# Patient Record
Sex: Female | Born: 1990 | Race: Black or African American | Hispanic: No | Marital: Single | Smoking: Never smoker
Health system: Southern US, Community
[De-identification: ages and names within clinical notes are randomized; demographics above are authoritative.]

## PROBLEM LIST (undated history)

## (undated) DIAGNOSIS — F32A Depression, unspecified: Secondary | ICD-10-CM

## (undated) DIAGNOSIS — I10 Essential (primary) hypertension: Secondary | ICD-10-CM

## (undated) DIAGNOSIS — K219 Gastro-esophageal reflux disease without esophagitis: Secondary | ICD-10-CM

## (undated) DIAGNOSIS — J45909 Unspecified asthma, uncomplicated: Secondary | ICD-10-CM

## (undated) DIAGNOSIS — M1990 Unspecified osteoarthritis, unspecified site: Secondary | ICD-10-CM

## (undated) DIAGNOSIS — F419 Anxiety disorder, unspecified: Secondary | ICD-10-CM

## (undated) DIAGNOSIS — T7840XA Allergy, unspecified, initial encounter: Secondary | ICD-10-CM

## (undated) HISTORY — DX: Unspecified asthma, uncomplicated: J45.909

## (undated) HISTORY — DX: Depression, unspecified: F32.A

## (undated) HISTORY — DX: Gastro-esophageal reflux disease without esophagitis: K21.9

## (undated) HISTORY — DX: Anxiety disorder, unspecified: F41.9

## (undated) HISTORY — DX: Allergy, unspecified, initial encounter: T78.40XA

## (undated) HISTORY — DX: Unspecified osteoarthritis, unspecified site: M19.90

---

## 2015-04-17 DIAGNOSIS — G43009 Migraine without aura, not intractable, without status migrainosus: Secondary | ICD-10-CM | POA: Insufficient documentation

## 2021-10-14 ENCOUNTER — Ambulatory Visit
Admission: EM | Admit: 2021-10-14 | Discharge: 2021-10-14 | Disposition: A | Discharge status: Home / Self Care | Payer: 59 | Attending: Physician Assistant | Admitting: Physician Assistant

## 2021-10-14 ENCOUNTER — Ambulatory Visit: Admission: EM | Admit: 2021-10-14 | Payer: Self-pay | Source: Home / Self Care

## 2021-10-14 DIAGNOSIS — T161XXA Foreign body in right ear, initial encounter: Secondary | ICD-10-CM | POA: Insufficient documentation

## 2021-10-14 DIAGNOSIS — I10 Essential (primary) hypertension: Secondary | ICD-10-CM | POA: Insufficient documentation

## 2021-10-14 DIAGNOSIS — J029 Acute pharyngitis, unspecified: Secondary | ICD-10-CM | POA: Insufficient documentation

## 2021-10-14 HISTORY — DX: Essential (primary) hypertension: I10

## 2021-10-14 LAB — GROUP A STREP BY PCR: Group A Strep by PCR: NOT DETECTED

## 2021-10-14 MED ORDER — AMOXICILLIN 500 MG PO CAPS: 500.0000 mg | ORAL_CAPSULE | Freq: Three times a day (TID) | ORAL | 0 refills | Status: AC

## 2021-10-14 NOTE — ED Provider Notes (Addendum)
[facility]    CSN: [id number] Arrival date & time: 10/14/21  1242      History   Chief Complaint Chief Complaint  Patient presents with   Sore Throat    HPI Brenda Salas is a 31 y.o. female.   Patient presents with fever, chills, body aches, sore throat and generalized headache for 1 day.  Decreased appetite but tolerating liquids.  No known sick contacts.  Has attempted use of Tylenol, ibuprofen and Mucinex which have been ineffective.  Denies coughing, shortness of breath, wheezing, congestion, abdominal pain, nausea, vomiting or diarrhea.  History of hypertension.    Past Medical History:  Diagnosis Date   Hypertension     Patient Active Problem List   Diagnosis Date Noted   Hypertension 10/14/2021   Migraine without aura or status migrainosus 04/17/2015    Past Surgical History:  Procedure Laterality Date   CESAREAN SECTION  2017    OB History   No obstetric history on file.      Home Medications    Prior to Admission medications   Medication Sig Start Date End Date Taking? Authorizing Provider  clindamycin (CLEOCIN T) 1 % lotion Apply topically 2 (two) times daily. 03/27/21  Yes [provider]  etonogestrel-ethinyl estradiol (NUVARING) 0.12-0.015 MG/24HR vaginal ring Place vaginally. 10/02/21  Yes [provider]  ibuprofen (ADVIL) 800 MG tablet Take by mouth. 05/09/20  Yes [provider]  losartan (COZAAR) 25 MG tablet Take 25 mg by mouth daily. 08/23/21  Yes [provider]  tranexamic acid (LYSTEDA) 650 MG TABS tablet Take 1,300 mg by mouth 3 (three) times daily. 09/17/21   [provider]    Family History No family history on file.  Social History Social History   Tobacco Use   Smoking status: Never   Smokeless tobacco: Never  Vaping Use   Vaping Use: Never used  Substance Use Topics   Alcohol use: Yes    Comment: Occ.   Drug use: Never     Allergies   Other   Review of  Systems Review of Systems Defer to HPI    Physical Exam Triage Vital Signs ED Triage Vitals  Enc Vitals Group     BP 10/14/21 1335 (!) 193/137     Pulse Rate 10/14/21 1335 (!) 115     Resp 10/14/21 1335 (!) 22     Temp 10/14/21 1335 (!) 100.6 F (38.1 C)     Temp Source 10/14/21 1335 Oral     SpO2 10/14/21 1335 100 %     Weight 10/14/21 1331 (!) 302 lb (137 kg)     Height 10/14/21 1331 5\' 4"  (1.626 m)     Head Circumference --      Peak Flow --      Pain Score 10/14/21 1323 9     Pain Loc --      Pain Edu? --      Excl. in GC? --    No data found.  Updated Vital Signs BP (!) 190/126 (BP Location: Left Arm)   Pulse (!) 115   Temp (!) 100.6 F (38.1 C) (Oral)   Resp (!) 22   Ht 5\' 4"  (1.626 m)   Wt (!) 302 lb (137 kg)   LMP  (LMP Unknown)   SpO2 100%   BMI 51.84 kg/m   Visual Acuity Right Eye Distance:   Left Eye Distance:   Bilateral Distance:    Right Eye Near:   Left  Eye Near:    Bilateral Near:     Physical Exam Constitutional:      Appearance: Normal appearance. She is well-developed.  HENT:     Head: Normocephalic.     Right Ear: Tympanic membrane and external ear normal.     Left Ear: Tympanic membrane, ear canal and external ear normal.     Ears:     Comments: Foreign body present to the right ear canal    Nose: No congestion or rhinorrhea.     Mouth/Throat:     Mouth: Mucous membranes are moist.     Pharynx: Posterior oropharyngeal erythema present.     Tonsils: Tonsillar exudate present. 2+ on the right. 2+ on the left.  Eyes:     Extraocular Movements: Extraocular movements intact.  Cardiovascular:     Rate and Rhythm: Regular rhythm. Tachycardia present.     Heart sounds: Normal heart sounds.  Pulmonary:     Effort: Pulmonary effort is normal.     Breath sounds: Normal breath sounds.  Musculoskeletal:     Cervical back: Normal range of motion and neck supple.  Neurological:     General: No focal deficit present.     Mental Status:  She is alert and oriented to person, place, and time.  Psychiatric:        Mood and Affect: Mood normal.        Behavior: Behavior normal.     UC Treatments / Results  Labs (all labs ordered are listed, but only abnormal results are displayed) Labs Reviewed  GROUP A STREP BY PCR    EKG   Radiology No results found.  Procedures Foreign Body Removal  Date/Time: 10/14/2021 1:56 PM Performed by: Valinda Hoar, NP Authorized by: Valinda Hoar, NP   Consent:    Consent obtained:  Verbal   Consent given by:  Patient   Risks, benefits, and alternatives were discussed: yes     Risks discussed:  Incomplete removal Universal protocol:    Procedure explained and questions answered to patient or proxy's satisfaction: yes     Patient identity confirmed:  Verbally with patient Location:    Location:  Ear   Ear location:  R ear   Depth: canal. Anesthesia:    Anesthesia method:  None Procedure details:    Localization method:  Visualized   Removal mechanism:  Forceps   Foreign bodies recovered:  1   Description:  Clear rubber back of earring   Intact foreign body removal: yes   Post-procedure details:    Neurovascular status: intact     Confirmation:  No additional foreign bodies on visualization   Skin closure:  None   Procedure completion:  Tolerated (including critical care time)  Medications Ordered in UC Medications - No data to display  Initial Impression / Assessment and Plan / UC Course  I have reviewed the triage vital signs and the nursing notes.  Pertinent labs & imaging results that were available during my care of the patient were reviewed by me and considered in my medical decision making (see chart for details).  Sore throat Foreign body of right ear, initial encounter  Fever 100.6 with associated tachycardia noted in triage, blood pressure 190/126, patient has not taken blood pressure medication today, no signs of hypertensive urgency, advised  patient to take medicine upon returning home, will move forward with treatment of strep based on examination as erythema, tonsillar adenopathy and exudate are noted on exam, amoxicillin 10-day course prescribed,  recommended continued use of Tylenol and ibuprofen for management of fevers and discomfort, may attempt salt water gargles, soft foods, warm liquids, teaspoons of honey and throat lozenges for additional support, may follow-up with urgent care or PCP as needed for persisting symptoms, work note given  Foreign body noted on examination, patient had no knowledge of presents, foreign body removed in its entirety, no further abnormalities noted to the right ear  Final diagnoses:  Sorethroat   Discharge Instructions   None    ED Prescriptions   None    PDMP not reviewed this encounter.   Valinda HoarWhite, Shaylon Gillean R, NP 10/14/21 1400    Salli QuarryWhite, Abryana Lykens R, [place]NP 10/19/21 [phone]

## 2021-10-14 NOTE — Discharge Instructions (Addendum)
We will move forward with treatment of strep based on your symptoms and presentation of your throat as it is reddened your tonsils are swollen and there are Brenda Salas patches with a fever in triage ? ?We will call you if your strep test is positive ? ?Take amoxicillin twice daily for the next 10 days ? ?Take Tylenol or ibuprofen every 6 hours for management of fevers and discomfort ? ?You may attempt salt water gargles, warm liquids, soft foods and teaspoons of honey for additional support ? ?Increase your fluid intake through use of water and electrolyte replacement substances to maintain hydration and to your appetite returns ? ?You may follow-up with urgent care as needed for persisting or worsening symptoms ? ? ? ?

## 2021-10-14 NOTE — ED Triage Notes (Signed)
Patient is here for "sore throat". "White patches seen in throat". Started on "Tuesday". No fever. No new/unexplained rash.  ?

## 2021-10-21 ENCOUNTER — Other Ambulatory Visit: Payer: Self-pay

## 2021-10-21 ENCOUNTER — Emergency Department
Admission: EM | Admit: 2021-10-21 | Discharge: 2021-10-21 | Disposition: A | Discharge status: Home / Self Care | Payer: 59 | Attending: Emergency Medicine | Admitting: Emergency Medicine

## 2021-10-21 ENCOUNTER — Emergency Department: Payer: 59

## 2021-10-21 ENCOUNTER — Ambulatory Visit
Admission: EM | Admit: 2021-10-21 | Discharge: 2021-10-21 | Disposition: A | Discharge status: Home / Self Care | Payer: 59

## 2021-10-21 DIAGNOSIS — I10 Essential (primary) hypertension: Secondary | ICD-10-CM | POA: Insufficient documentation

## 2021-10-21 DIAGNOSIS — R42 Dizziness and giddiness: Secondary | ICD-10-CM

## 2021-10-21 DIAGNOSIS — Z79899 Other long term (current) drug therapy: Secondary | ICD-10-CM | POA: Insufficient documentation

## 2021-10-21 DIAGNOSIS — H81391 Other peripheral vertigo, right ear: Secondary | ICD-10-CM | POA: Insufficient documentation

## 2021-10-21 LAB — URINALYSIS, ROUTINE W REFLEX MICROSCOPIC
Bilirubin Urine: NEGATIVE
Glucose, UA: NEGATIVE mg/dL
Ketones, ur: NEGATIVE mg/dL
Leukocytes,Ua: NEGATIVE
Nitrite: NEGATIVE
Protein, ur: 30 mg/dL — AB
Specific Gravity, Urine: 1.025 (ref 1.005–1.030)
pH: 5 (ref 5.0–8.0)

## 2021-10-21 LAB — CBC
HCT: 44.2 % (ref 36.0–46.0)
Hemoglobin: 14.3 g/dL (ref 12.0–15.0)
MCH: 28.4 pg (ref 26.0–34.0)
MCHC: 32.4 g/dL (ref 30.0–36.0)
MCV: 87.7 fL (ref 80.0–100.0)
Platelets: 511 10*3/uL — ABNORMAL HIGH (ref 150–400)
RBC: 5.04 MIL/uL (ref 3.87–5.11)
RDW: 12.3 % (ref 11.5–15.5)
WBC: 10.3 10*3/uL (ref 4.0–10.5)
nRBC: 0 % (ref 0.0–0.2)

## 2021-10-21 LAB — BASIC METABOLIC PANEL
Anion gap: 10 (ref 5–15)
BUN: 12 mg/dL (ref 6–20)
CO2: 24 mmol/L (ref 22–32)
Calcium: 9.7 mg/dL (ref 8.9–10.3)
Chloride: 103 mmol/L (ref 98–111)
Creatinine, Ser: 0.67 mg/dL (ref 0.44–1.00)
GFR, Estimated: >60 mL/min (ref >60)
Glucose, Bld: 97 mg/dL (ref 70–99)
Potassium: 3.8 mmol/L (ref 3.5–5.1)
Sodium: 137 mmol/L (ref 135–145)

## 2021-10-21 MED ORDER — CLONIDINE HCL 0.1 MG PO TABS
0.2000 mg | ORAL_TABLET | Freq: Once | ORAL | Status: AC
  Administered 2021-10-21: 0.2 mg via ORAL
  Filled 2021-10-21: qty 2

## 2021-10-21 MED ORDER — MECLIZINE HCL 25 MG PO TABS
25.0000 mg | ORAL_TABLET | Freq: Once | ORAL | Status: AC
  Administered 2021-10-21: 25 mg via ORAL

## 2021-10-21 MED ORDER — AMLODIPINE BESYLATE 10 MG PO TABS: 10.0000 mg | ORAL_TABLET | Freq: Every day | ORAL | 2 refills | Status: DC

## 2021-10-21 MED ORDER — AMLODIPINE BESYLATE 5 MG PO TABS
10.0000 mg | ORAL_TABLET | Freq: Once | ORAL | Status: AC
  Administered 2021-10-21: 10 mg via ORAL
  Filled 2021-10-21: qty 2

## 2021-10-21 NOTE — ED Triage Notes (Signed)
Pt comes with c/o dizziness since last night. Pt states elevated BP as well. Pt states she take BP meds. Pt denies any headache, numbness or tingling.

## 2021-10-21 NOTE — ED Provider Notes (Signed)
[facility] Provider Note    Event Date/Time   First MD Initiated Contact with Patient 10/21/21 1457     (approximate)   History   Chief Complaint: Dizziness   HPI  Brenda Salas is a 31 y.o. female with a history of hypertension and migraines who comes ED complaining of dizziness since last night.  Dizziness feels like room spinning and being off balance, triggered by turning her head to the right, alleviated by turning her head to the left.  No vision changes or focal paresthesias or motor weakness.  No recent trauma, no fevers or neck pain or stiffness.  No headache.  She went to urgent care where she was found to be severely hypertensive.  They gave a dose of meclizine which did help with symptoms and she reports it is almost resolved at this point, but blood pressure is still very elevated.  She notes that she used to be taking only amlodipine 10 mg once a day for blood pressure but this did not provide adequate control so her doctor discontinued the amlodipine and started her on Cozaar 25 mg once a day.  She has been compliant with this medicine.  She was given a dose of clonidine earlier at 12:00 PM in the ED.     Physical Exam   Triage Vital Signs: ED Triage Vitals  Enc Vitals Group     BP 10/21/21 1136 (!) 237/146     Pulse Rate 10/21/21 1136 (!) 117     Resp 10/21/21 1136 20     Temp 10/21/21 1136 98 F (36.7 C)     Temp Source 10/21/21 1137 Oral     SpO2 10/21/21 1136 100 %     Weight --      Height --      Head Circumference --      Peak Flow --      Pain Score 10/21/21 1134 0     Pain Loc --      Pain Edu? --      Excl. in GC? --     Most recent vital signs: Vitals:   10/21/21 1501 10/21/21 1559  BP: (!) 203/113 (!) 203/122  Pulse: 85 80  Resp: 17 18  Temp:    SpO2: 100% 98%    General: Awake, no distress.  CV:  Good peripheral perfusion.  Resp:  Normal effort.  Abd:  No distention.  Other:  EOMI, normal balance,  normal gait.  Normal speech.  Cranial nerves II through XII intact.   ED Results / Procedures / Treatments   Labs (all labs ordered are listed, but only abnormal results are displayed) Labs Reviewed  CBC - Abnormal; Notable for the following components:      Result Value   Platelets 511 (*)    All other components within normal limits  URINALYSIS, ROUTINE W REFLEX MICROSCOPIC - Abnormal; Notable for the following components:   Color, Urine YELLOW (*)    APPearance HAZY (*)    Hgb urine dipstick MODERATE (*)    Protein, ur 30 (*)    Bacteria, UA FEW (*)    All other components within normal limits  BASIC METABOLIC PANEL  CBG MONITORING, ED  POC URINE PREG, ED     EKG    RADIOLOGY CT head viewed and interpreted by me, negative for intracranial hemorrhage or mass.  Radiology report reviewed.   PROCEDURES:  Procedures   MEDICATIONS ORDERED IN ED: Medications  cloNIDine (CATAPRES)  tablet 0.2 mg (0.2 mg Oral Given 10/21/21 1148)  amLODipine (NORVASC) tablet 10 mg (10 mg Oral Given 10/21/21 1559)     IMPRESSION / MDM / ASSESSMENT AND PLAN / ED COURSE  I reviewed the triage vital signs and the nursing notes.                              Differential diagnosis includes, but is not limited to, symptomatic hypertension, peripheral vertigo, intracranial hemorrhage, intracranial mass  Patient's presentation is most consistent with acute presentation with potential threat to life or bodily function.  Patient presents with severe uncontrolled hypertension, which I think is due to transition from Norvasc to Cozaar.  Cozaar is at relatively low dose as well.  I think she will need at least 2 blood pressure medicines going forward given the degree of hypertension.  After clonidine, blood pressure improved slightly to 200/110.  I will give her 10 mg Norvasc and plan for her to take Norvasc and Cozaar until primary care follow-up.  Due to her very high blood pressure and neuro  symptoms, I am obtaining a CT of the head to evaluate for intracranial hemorrhage or structural abnormality.  She does not have any focal neurodeficits, so I think if this is okay she will not require admission.   Clinical Course as of 10/21/21 1651  Wed Oct 21, 2021  1622 CT head is normal. [PS]    Clinical Course User Index [PS] Sharman Cheek, MD     FINAL CLINICAL IMPRESSION(S) / ED DIAGNOSES   Final diagnoses:  Peripheral vertigo involving right ear  Hypertension, unspecified type     Rx / DC Orders   ED Discharge Orders          Ordered    amLODipine (NORVASC) 10 MG tablet  Daily        10/21/21 1650             Note:  This document was prepared using Dragon voice recognition software and may include unintentional dictation errors.   Sharman Cheek, MD 10/21/21 1651

## 2021-10-21 NOTE — Discharge Instructions (Addendum)
Due to your continued elevated blood pressure combined with your neurological symptoms of dizziness and room spinning I feel that you would best be served to be evaluated in the emergency department.  Your elevated blood pressure may be contributing to your symptoms and you may need pharmacological intervention to bring it down.  We do not have any blood pressure medication here in the urgent care to give you.  Please go to [facility] as we discussed for evaluation.

## 2021-10-21 NOTE — ED Provider Notes (Signed)
[facility]    CSN: [id number] Arrival date & time: 10/21/21  0815      History   Chief Complaint Chief Complaint  Patient presents with   Dizziness    HPI Brenda Salas is a 31 y.o. female.   HPI  31 year old female here for evaluation of dizziness.  Patient reports that her dizziness started abruptly when she turned from her left side to her right side in bed.  This morning her dizziness was significant enough that it caused her to have nausea and vomiting.  She states that she took her blood pressure medication after her vomiting and has not had any further episodes.  She states that her dizziness improves when she keeps her head cocked to the right-hand side but when she looks back to the left her symptoms worsen.  She denies any change in vision, headache, chest pain, or shortness of breath.  Significant medical history includes hypertension and a BMI of 51.  Vital signs obtained at triage show a blood pressure of 222/119 with a follow-up reading of 225/119.  Staff took vital signs standing at 0-minute showing 194/120 and then at 3-minute showing 201/190.  Past Medical History:  Diagnosis Date   Hypertension     Patient Active Problem List   Diagnosis Date Noted   Hypertension 10/14/2021   Migraine without aura or status migrainosus 04/17/2015    Past Surgical History:  Procedure Laterality Date   CESAREAN SECTION  2017    OB History   No obstetric history on file.      Home Medications    Prior to Admission medications   Medication Sig Start Date End Date Taking? Authorizing Provider  amoxicillin (AMOXIL) 500 MG capsule Take 1 capsule (500 mg total) by mouth 3 (three) times daily for 10 days. Patient taking differently: Take 500 mg by mouth 3 (three) times daily. Not yet, Haven't taken today's dose. 10/14/21 10/24/21 Yes White, Adrienne R, NP  losartan (COZAAR) 25 MG tablet Take 25 mg by mouth daily. 08/23/21  Yes [provider]   amoxicillin (AMOXIL) 500 MG tablet Take 500 mg by mouth 3 (three) times daily. 10/14/21   [provider]  clindamycin (CLEOCIN T) 1 % lotion Apply topically 2 (two) times daily. 03/27/21   [provider]  etonogestrel-ethinyl estradiol (NUVARING) 0.12-0.015 MG/24HR vaginal ring Place vaginally. 10/02/21   [provider]  ibuprofen (ADVIL) 800 MG tablet Take by mouth. 05/09/20   [provider]  tranexamic acid (LYSTEDA) 650 MG TABS tablet Take 1,300 mg by mouth 3 (three) times daily. 09/17/21   [provider]    Family History History reviewed. No pertinent family history.  Social History Social History   Tobacco Use   Smoking status: Never   Smokeless tobacco: Never  Vaping Use   Vaping Use: Never used  Substance Use Topics   Alcohol use: Yes    Comment: Occ.   Drug use: Never     Allergies   Other   Review of Systems Review of Systems  Eyes:  Negative for visual disturbance.  Respiratory:  Negative for shortness of breath.   Cardiovascular:  Negative for chest pain.  Gastrointestinal:  Positive for nausea and vomiting.  Neurological:  Positive for dizziness. Negative for headaches.  Hematological: Negative.   Psychiatric/Behavioral: Negative.      Physical Exam Triage Vital Signs ED Triage Vitals  Enc Vitals Group     BP --      Pulse Rate  10/21/21 0831 88     Resp 10/21/21 0831 20     Temp 10/21/21 0831 98.2 F (36.8 C)     Temp Source 10/21/21 0831 Oral     SpO2 10/21/21 0831 99 %     Weight 10/21/21 0829 (!) 302 lb (137 kg)     Height 10/21/21 0829 5\' 4"  (1.626 m)     Head Circumference --      Peak Flow --      Pain Score --      Pain Loc --      Pain Edu? --      Excl. in GC? --    Orthostatic VS for the past 24 hrs:  BP- Lying Pulse- Lying BP- Standing at 0 minutes Pulse- Standing at 0 minutes  10/21/21 0834 (!) 222/119 94 (!) 194/120 113    Updated Vital Signs BP (!) 217/113 (BP Location: Left  Arm) Comment: 30+ mins post Antivert.  Pulse 88   Temp 98.2 F (36.8 C) (Oral)   Resp 20   Ht 5\' 4"  (1.626 m)   Wt (!) 302 lb (137 kg)   LMP 10/14/2021 (Approximate)   SpO2 99%   BMI 51.84 kg/m   Visual Acuity Right Eye Distance:   Left Eye Distance:   Bilateral Distance:    Right Eye Near:   Left Eye Near:    Bilateral Near:     Physical Exam Vitals and nursing note reviewed.  Constitutional:      Appearance: Normal appearance. She is not ill-appearing.  HENT:     Head: Normocephalic and atraumatic.     Right Ear: Tympanic membrane, ear canal and external ear normal. There is no impacted cerumen.     Left Ear: Tympanic membrane, ear canal and external ear normal. There is no impacted cerumen.     Mouth/Throat:     Mouth: Mucous membranes are moist.     Pharynx: Oropharynx is clear. No oropharyngeal exudate or posterior oropharyngeal erythema.  Eyes:     General: No scleral icterus.    Extraocular Movements: Extraocular movements intact.     Conjunctiva/sclera: Conjunctivae normal.     Pupils: Pupils are equal, round, and reactive to light.  Neck:     Vascular: No carotid bruit.  Cardiovascular:     Rate and Rhythm: Normal rate and regular rhythm.     Pulses: Normal pulses.     Heart sounds: Normal heart sounds. No murmur heard.   No friction rub. No gallop.  Pulmonary:     Effort: Pulmonary effort is normal.     Breath sounds: Normal breath sounds. No wheezing, rhonchi or rales.  Musculoskeletal:     Cervical back: Normal range of motion and neck supple.  Skin:    General: Skin is warm and dry.     Capillary Refill: Capillary refill takes less than 2 seconds.     Findings: No erythema or rash.  Neurological:     General: No focal deficit present.     Mental Status: She is alert and oriented to person, place, and time.  Psychiatric:        Mood and Affect: Mood normal.        Behavior: Behavior normal.        Thought Content: Thought content normal.         Judgment: Judgment normal.     UC Treatments / Results  Labs (all labs ordered are listed, but only abnormal results are displayed) Labs Reviewed -  No data to display  EKG EKG shows sinus tachycardia with ventricular of 101 bpm PR interval 146 ms QRS duration 94 ms QT/QTc 354/459 ms No ST or T wave abnormalities.  Lead V5 will not register and therefore the EKG is incomplete. No other tracings available for comparison in epic   Radiology No results found.  Procedures Procedures (including critical care time)  Medications Ordered in UC Medications  meclizine (ANTIVERT) tablet 25 mg (25 mg Oral Given 10/21/21 0931)    Initial Impression / Assessment and Plan / UC Course  I have reviewed the triage vital signs and the nursing notes.  Pertinent labs & imaging results that were available during my care of the patient were reviewed by me and considered in my medical decision making (see chart for details).  Patient is a pleasant, nontoxic-appearing 31 year old female here for evaluation of dizziness that started this morning as outlined in HPI above.  On exam patient is alert and oriented x3 and is sitting reclined on the exam table with her head cocked to the right.  Patient's pupils are equal round and reactive and she has normal red light reflex in both eyes.  With EOM patient demonstrates nystagmus with her vision looking to the right but it is not present when looking to the left.  Patient states that she does not have a history of vertigo and she has never had symptoms like this in the past.  Given her elevated blood pressure an EKG was collected at triage which they were unable to capture lead V5.  Range and EKG shows sinus tach with ventricular rate of 101 but no ST or T wave abnormalities.  The V5 did not pick up therefore I do not have a complete tracing.  Also there are no other EKGs to compare tracings.  I suspect that the patient has a stone in one of her semicircular canals.   Given the nystagmus to the right and the left I am assuming that the stone is in the right ear.  I attempted an Epley maneuver which improved the patient's dizziness mildly but it did induce vomiting and patient reports that she is still having room spinning.  I have ordered a dose of meclizine and will reassess.  Patient's elevated blood pressure may be secondary to her ongoing dizziness or her dizziness may be a symptom of her blood pressure.  If there is no improvement in patient's symptoms following meclizine administration I have recommended that the patient be evaluated in the ER and she is in agreement.  Patient reassessed following meclizine administration.  She states that her dizziness and room spinning sensations have not really abated despite the medication.  I performed a cardiopulmonary exam at this time and she has S1-S2 heart sounds with regular rate and rhythm and her lung sounds are clear to auscultation all fields.  I did auscultate for bruits over both carotids and none are present.  I have ordered the staff to recheck the patient's blood pressure.  Given her elevated BP and continue neurological symptoms I feel she would best be served to be evaluated in the emergency department.  Patient's repeat blood pressure was 217/113.  Given her sequestra of symptoms and ongoing elevated blood pressure despite taking her medications patient will be evaluated in the emergency department.  She has elected to go via POV to [facility] as that is where her care has been performed.  Her sister will take her.  I assisted the patient to  sit up and then stand, which she did without difficulty.  She states she has a mild improvement of her dizziness.  She is able to walk with a tandem gait without difficulty.  She left the urgent care ambulatory in stable condition.   Final Clinical Impressions(s) / UC Diagnoses   Final diagnoses:  Dizziness  Hypertension, unspecified type     Discharge  Instructions      Due to your continued elevated blood pressure combined with your neurological symptoms of dizziness and room spinning I feel that you would best be served to be evaluated in the emergency department.  Your elevated blood pressure may be contributing to your symptoms and you may need pharmacological intervention to bring it down.  We do not have any blood pressure medication here in the urgent care to give you.  Please go to [facility] as we discussed for evaluation.     ED Prescriptions   None    PDMP not reviewed this encounter.   Becky Augusta, NP 10/21/21 1014

## 2021-10-21 NOTE — ED Triage Notes (Signed)
Patient presents to Urgent Care with complaints of dizziness since last night. Pt has a hx of HTN she states being compliant with meds. She states last BP check 2 weeks ago and WNL.  Denies any changes in vision, HA, or numbness or tingling in extremities.

## 2021-10-21 NOTE — Discharge Instructions (Signed)
Your CT scan of the head is normal.  Please continue taking amlodipine 10 mg and your Cozaar every day, and follow-up with your primary care doctor in a week for blood pressure recheck.

## 2021-10-21 NOTE — ED Triage Notes (Signed)
Patient is her\e for "dizziness" that started last night around midnight. Went to turn over "and room was spinning really fast". Not as bad as it was "but remains". Nausea, Vomiting "x1 this am". No chest pain. No sob. No runny nose. No cough. PO's "ok". Output "normal".

## 2021-10-21 NOTE — ED Provider Triage Note (Signed)
Emergency Medicine Provider Triage Evaluation Note  Brenda Salas , a 31 y.o. female  was evaluated in triage.  Pt complains of dizziness since last night with hypertension. She is currently on losartan and has been taking as prescribed. Currently being treated for strep with amoxicillin but no otc medication.  Review of Systems  Positive: Dizziness Negative: Chest pain, shortness of breath  Physical Exam  LMP 10/14/2021 (Approximate)  Gen:   Awake, no distress   Resp:  Normal effort  MSK:   Moves extremities without difficulty  Other:    Medical Decision Making  Medically screening exam initiated at 11:34 AM.  Appropriate orders placed.  Brenda Salas was informed that the remainder of the evaluation will be completed by another provider, this initial triage assessment does not replace that evaluation, and the importance of remaining in the ED until their evaluation is complete.  [place] ordered.   Chinita Pester, FNP 10/21/21 1138
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
## 2022-03-18 ENCOUNTER — Other Ambulatory Visit: Payer: Self-pay

## 2022-03-18 ENCOUNTER — Encounter: Payer: Self-pay | Admitting: Intensive Care

## 2022-03-18 ENCOUNTER — Emergency Department: Payer: 59

## 2022-03-18 ENCOUNTER — Emergency Department
Admission: EM | Admit: 2022-03-18 | Discharge: 2022-03-18 | Disposition: A | Discharge status: Home / Self Care | Payer: 59 | Attending: Student in an Organized Health Care Education/Training Program | Admitting: Student in an Organized Health Care Education/Training Program

## 2022-03-18 DIAGNOSIS — M25561 Pain in right knee: Secondary | ICD-10-CM | POA: Diagnosis not present

## 2022-03-18 DIAGNOSIS — Y92410 Unspecified street and highway as the place of occurrence of the external cause: Secondary | ICD-10-CM | POA: Diagnosis not present

## 2022-03-18 DIAGNOSIS — S161XXA Strain of muscle, fascia and tendon at neck level, initial encounter: Secondary | ICD-10-CM | POA: Insufficient documentation

## 2022-03-18 DIAGNOSIS — R079 Chest pain, unspecified: Secondary | ICD-10-CM | POA: Diagnosis not present

## 2022-03-18 DIAGNOSIS — S199XXA Unspecified injury of neck, initial encounter: Secondary | ICD-10-CM | POA: Diagnosis present

## 2022-03-18 LAB — COMPREHENSIVE METABOLIC PANEL
ALT: 16 U/L (ref 0–44)
AST: 19 U/L (ref 15–41)
Albumin: 3.4 g/dL — ABNORMAL LOW (ref 3.5–5.0)
Alkaline Phosphatase: 82 U/L (ref 38–126)
Anion gap: 6 (ref 5–15)
BUN: 11 mg/dL (ref 6–20)
CO2: 25 mmol/L (ref 22–32)
Calcium: 8.8 mg/dL — ABNORMAL LOW (ref 8.9–10.3)
Chloride: 105 mmol/L (ref 98–111)
Creatinine, Ser: 0.59 mg/dL (ref 0.44–1.00)
GFR, Estimated: >60 mL/min (ref >60)
Glucose, Bld: 88 mg/dL (ref 70–99)
Potassium: 3.2 mmol/L — ABNORMAL LOW (ref 3.5–5.1)
Sodium: 136 mmol/L (ref 135–145)
Total Bilirubin: 0.5 mg/dL (ref 0.3–1.2)
Total Protein: 7.6 g/dL (ref 6.5–8.1)

## 2022-03-18 LAB — CBC WITH DIFFERENTIAL/PLATELET
Abs Immature Granulocytes: 0.03 10*3/uL (ref 0.00–0.07)
Basophils Absolute: 0 10*3/uL (ref 0.0–0.1)
Basophils Relative: 0 %
Eosinophils Absolute: 0 10*3/uL (ref 0.0–0.5)
Eosinophils Relative: 0 %
HCT: 39 % (ref 36.0–46.0)
Hemoglobin: 12.7 g/dL (ref 12.0–15.0)
Immature Granulocytes: 0 %
Lymphocytes Relative: 21 %
Lymphs Abs: 1.7 10*3/uL (ref 0.7–4.0)
MCH: 29 pg (ref 26.0–34.0)
MCHC: 32.6 g/dL (ref 30.0–36.0)
MCV: 89 fL (ref 80.0–100.0)
Monocytes Absolute: 0.5 10*3/uL (ref 0.1–1.0)
Monocytes Relative: 7 %
Neutro Abs: 5.8 10*3/uL (ref 1.7–7.7)
Neutrophils Relative %: 72 %
Platelets: 364 10*3/uL (ref 150–400)
RBC: 4.38 MIL/uL (ref 3.87–5.11)
RDW: 11.9 % (ref 11.5–15.5)
WBC: 8.2 10*3/uL (ref 4.0–10.5)
nRBC: 0 % (ref 0.0–0.2)

## 2022-03-18 LAB — TROPONIN I (HIGH SENSITIVITY): Troponin I (High Sensitivity): 7 ng/L (ref <18)

## 2022-03-18 LAB — POC URINE PREG, ED: Preg Test, Ur: NEGATIVE

## 2022-03-18 MED ORDER — OXYCODONE-ACETAMINOPHEN 5-325 MG PO TABS: 1.0000 | ORAL_TABLET | ORAL | 0 refills | Status: DC | PRN

## 2022-03-18 MED ORDER — OXYCODONE-ACETAMINOPHEN 5-325 MG PO TABS
1.0000 | ORAL_TABLET | Freq: Once | ORAL | Status: AC
  Administered 2022-03-18: 1 via ORAL
  Filled 2022-03-18: qty 1
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
## 2022-06-30 IMAGING — CT CT HEAD W/O CM
4 series · 17 of 47 positions shown, 19 images · non-contrast
Comparison: None Available.

CLINICAL DATA: Dizziness



[Series 2: head wo · axial · 0.44mm/px · z∈[+176,+296]mm · 7 of 33 slices shown, 9 images]
[im 5/33  brain]
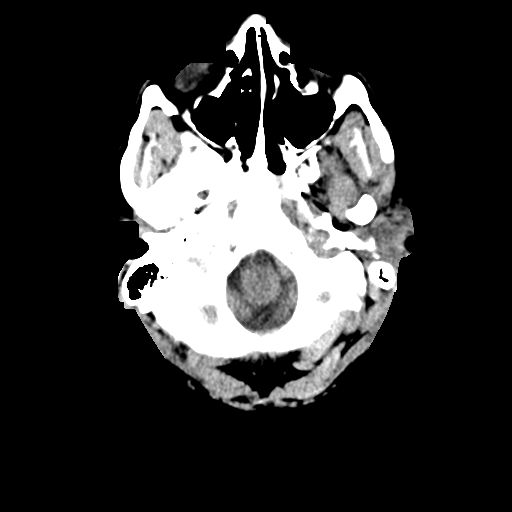
[im 5/33  bone]
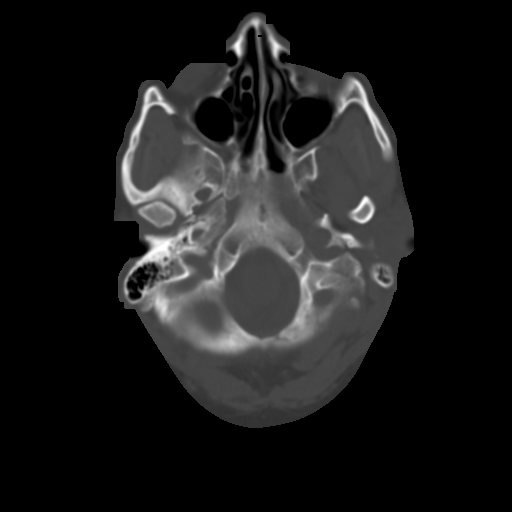
[im 9/33  brain]
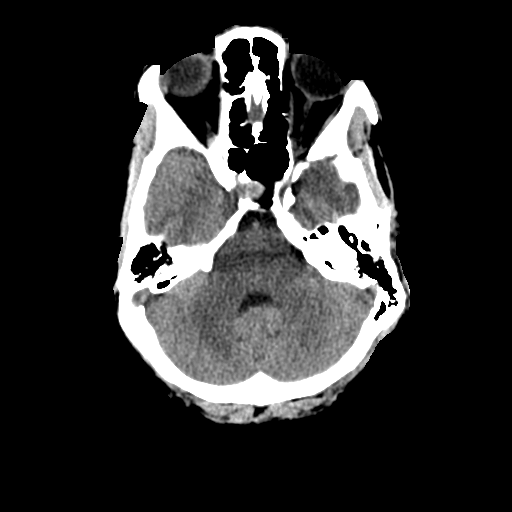
[im 13/33  brain]
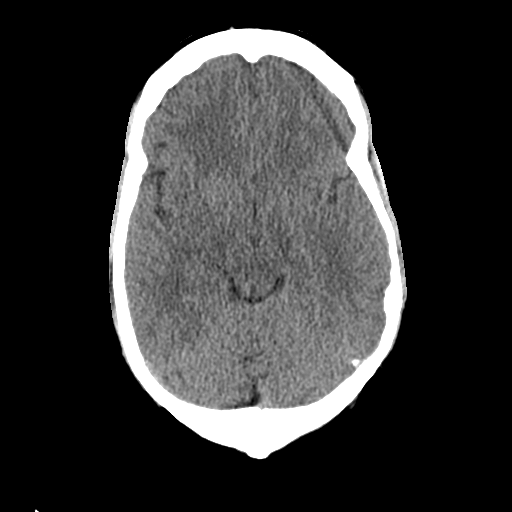
[im 17/33  brain]
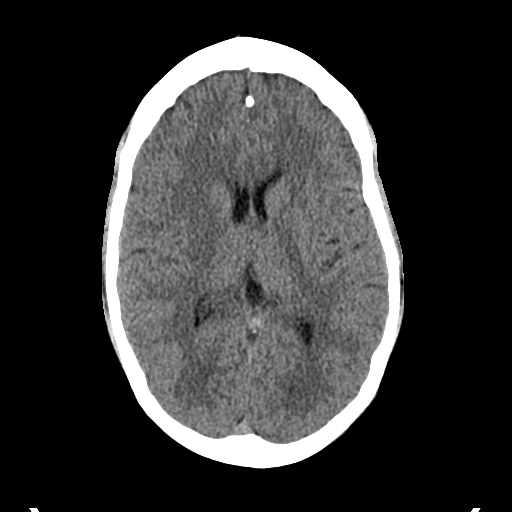
[im 21/33  brain]
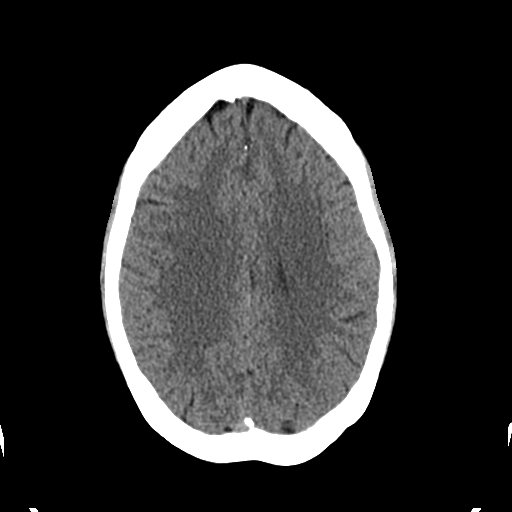
[im 21/33  bone]
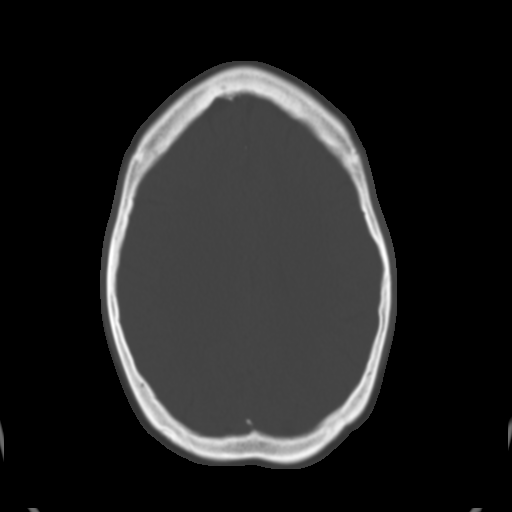
[im 25/33  brain]
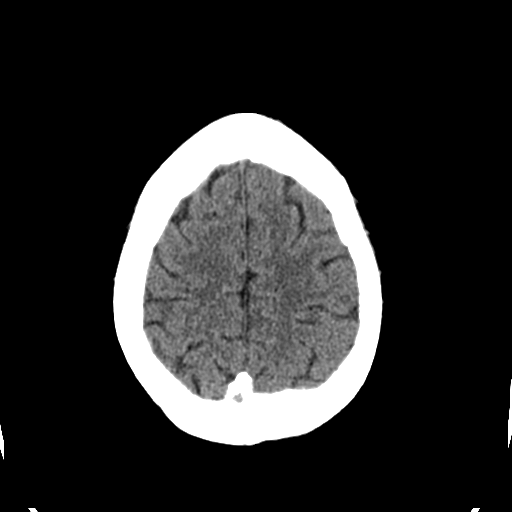
[im 29/33  brain]
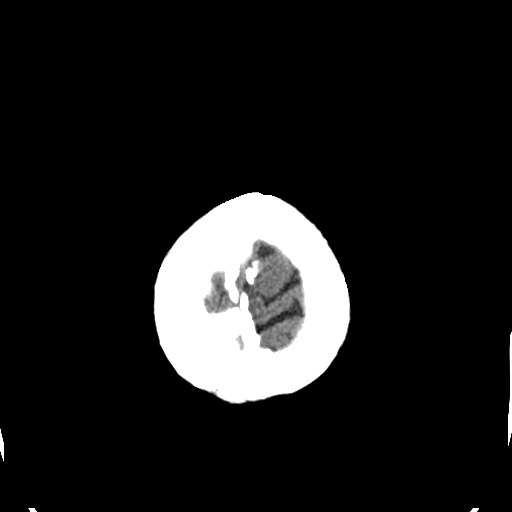

[Series 3: head bone · axial · 0.44mm/px · z∈[+172,+228]mm · 4 of 81 slices shown]
[im 9/81  bone]
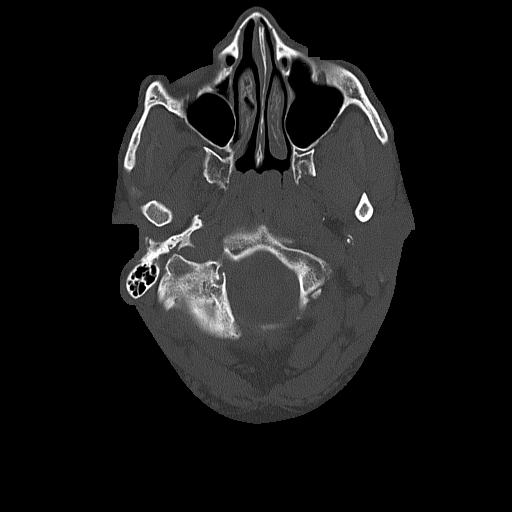
[im 17/81  bone]
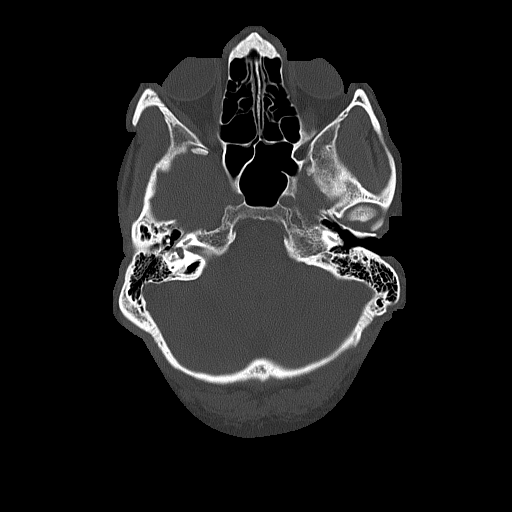
[im 25/81  bone]
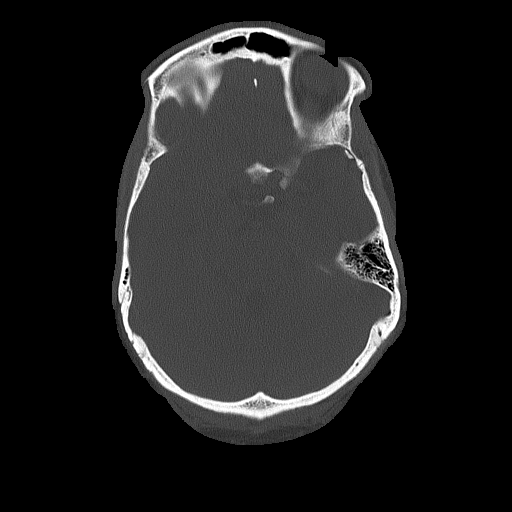
[im 37/81  bone]
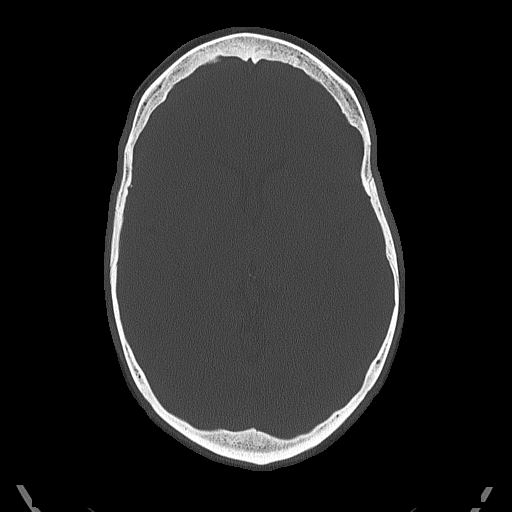

[Series 4: coronal soft tissue · coronal · 0.31mm/px · 3 of 70 slices shown]
[im 24/70  brain]
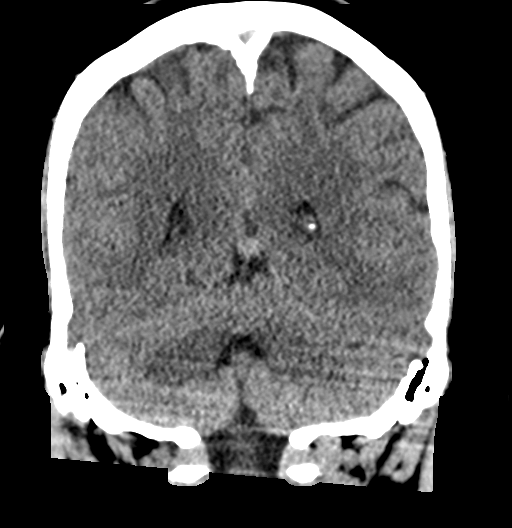
[im 31/70  brain]
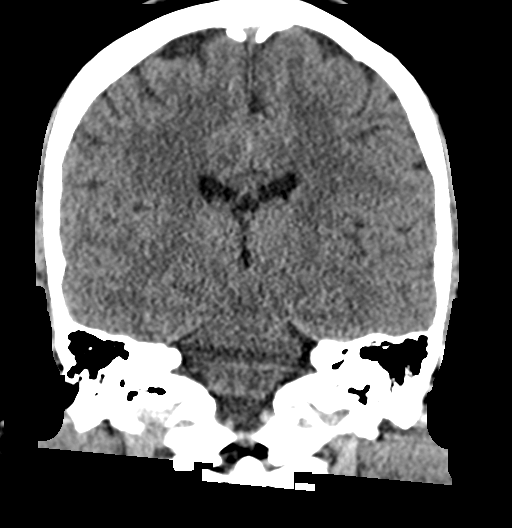
[im 39/70  brain]
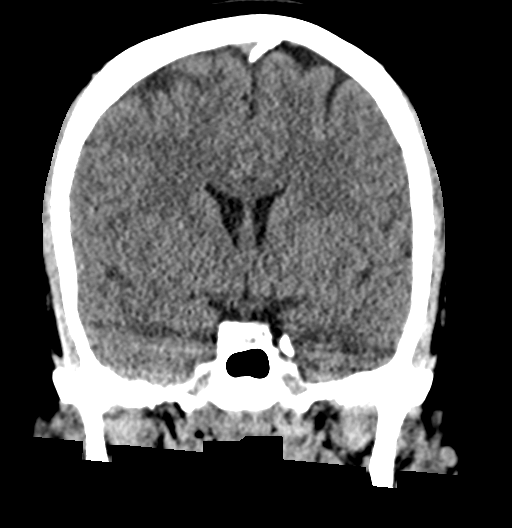

[Series 5: sagittal soft tissue · sagittal · 0.32mm/px · 3 of 54 slices shown]
[im 18/54  brain]
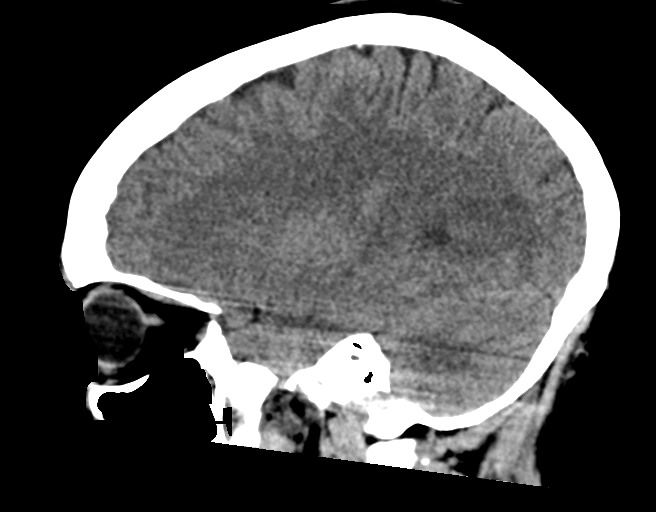
[im 27/54  brain]
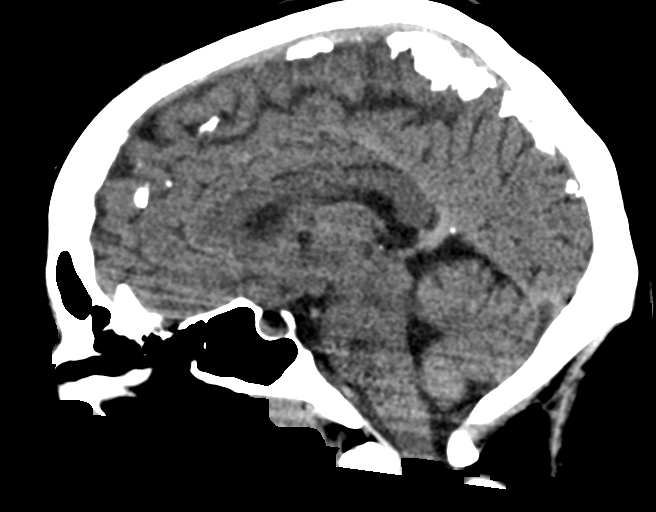
[im 36/54  brain]
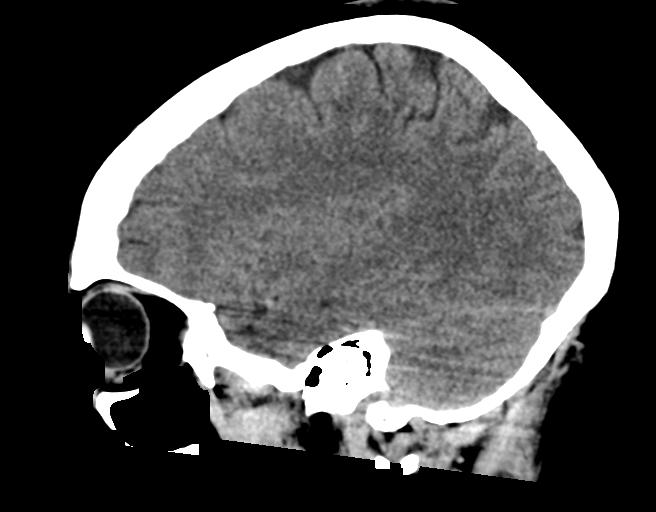

[17 of 47 positions shown; findings below may reference images not displayed]

FINDINGS: Brain: No evidence of acute infarction, hemorrhage, hydrocephalus,
extra-axial collection or mass lesion/mass effect.

Vascular: Negative for hyperdense vessel

Skull: Negative

Sinuses/Orbits: Negative

Other: None
IMPRESSION: Normal CT head

## 2022-09-14 LAB — HM HEPATITIS C SCREENING LAB: HM Hepatitis Screen: NEGATIVE

## 2022-09-25 ENCOUNTER — Ambulatory Visit
Admission: EM | Admit: 2022-09-25 | Discharge: 2022-09-25 | Disposition: A | Discharge status: Home / Self Care | Payer: 59 | Attending: Physician Assistant | Admitting: Physician Assistant

## 2022-09-25 DIAGNOSIS — J029 Acute pharyngitis, unspecified: Secondary | ICD-10-CM | POA: Diagnosis not present

## 2022-09-25 DIAGNOSIS — R490 Dysphonia: Secondary | ICD-10-CM | POA: Diagnosis present

## 2022-09-25 DIAGNOSIS — R051 Acute cough: Secondary | ICD-10-CM

## 2022-09-25 DIAGNOSIS — J351 Hypertrophy of tonsils: Secondary | ICD-10-CM | POA: Diagnosis not present

## 2022-09-25 DIAGNOSIS — J069 Acute upper respiratory infection, unspecified: Secondary | ICD-10-CM | POA: Diagnosis not present

## 2022-09-25 LAB — GROUP A STREP BY PCR: Group A Strep by PCR: NOT DETECTED

## 2022-09-25 MED ORDER — PSEUDOEPH-BROMPHEN-DM 30-2-10 MG/5ML PO SYRP: 10.0000 mL | ORAL_SOLUTION | Freq: Four times a day (QID) | ORAL | 0 refills | Status: AC | PRN

## 2022-09-25 MED ORDER — TRIAMCINOLONE ACETONIDE 55 MCG/ACT NA AERO: 2.0000 | INHALATION_SPRAY | Freq: Every day | NASAL | 0 refills | Status: DC

## 2022-09-25 MED ORDER — PREDNISONE 20 MG PO TABS: 40.0000 mg | ORAL_TABLET | Freq: Every day | ORAL | 0 refills | Status: AC

## 2022-09-25 NOTE — ED Provider Notes (Signed)
[facility]    CSN: [id number] Arrival date & time: 09/25/22  4098      History   Chief Complaint Chief Complaint  Patient presents with   Facial Pain    HPI Brenda Salas is a 32 y.o. female presenting for approximately 3-day history of low-grade temperature of 100.2 degrees, fatigue, cough, congestion, sinus pressure, sore and scratchy throat, bilateral ear pressure.  Denies high-grade fever, chest pain, shortness of breath, nausea/vomiting or diarrhea.  Denies any sick contacts.  Has tried Mucinex and nasal spray without relief.  She reports taking COVID and flu test yesterday and states they were negative.  No other complaints.  HPI  Past Medical History:  Diagnosis Date   Hypertension     Patient Active Problem List   Diagnosis Date Noted   Hypertension 10/14/2021   Migraine without aura or status migrainosus 04/17/2015    Past Surgical History:  Procedure Laterality Date   CESAREAN SECTION  2017    OB History   No obstetric history on file.      Home Medications    Prior to Admission medications   Medication Sig Start Date End Date Taking? Authorizing Provider  brompheniramine-pseudoephedrine-DM 30-2-10 MG/5ML syrup Take 10 mLs by mouth 4 (four) times daily as needed for up to 7 days. 09/25/22 10/02/22 Yes Shirlee Latch, PA-C  buPROPion (WELLBUTRIN XL) 150 MG 24 hr tablet Take by mouth. 09/14/22 09/14/23 Yes [provider]  chlorthalidone (HYGROTON) 25 MG tablet Take 25 mg by mouth daily.   Yes [provider]  clindamycin (CLEOCIN T) 1 % lotion Apply topically 2 (two) times daily. 03/27/21  Yes [provider]  etonogestrel-ethinyl estradiol (NUVARING) 0.12-0.015 MG/24HR vaginal ring Place vaginally. 10/02/21  Yes [provider]  ibuprofen (ADVIL) 800 MG tablet Take by mouth. 05/09/20  Yes [provider]  losartan (COZAAR) 25 MG tablet Take 25 mg by mouth daily. 08/23/21  Yes [provider]   predniSONE (DELTASONE) 20 MG tablet Take 2 tablets (40 mg total) by mouth daily for 5 days. 09/25/22 09/30/22 Yes Eusebio Friendly B, PA-C  triamcinolone (NASACORT) 55 MCG/ACT AERO nasal inhaler Place 2 sprays into the nose daily. 09/25/22  Yes Eusebio Friendly B, PA-C  amLODipine (NORVASC) 10 MG tablet Take 1 tablet (10 mg total) by mouth daily. 10/21/21 01/19/22  Sharman Cheek, MD  tranexamic acid (LYSTEDA) 650 MG TABS tablet Take 1,300 mg by mouth 3 (three) times daily. 09/17/21   [provider]    Family History History reviewed. No pertinent family history.  Social History Social History   Tobacco Use   Smoking status: Never   Smokeless tobacco: Never  Vaping Use   Vaping Use: Never used  Substance Use Topics   Alcohol use: Yes    Comment: Occ.   Drug use: Never     Allergies   Monistat [miconazole] and Other   Review of Systems Review of Systems  Constitutional:  Positive for fatigue. Negative for chills, diaphoresis and fever.  HENT:  Positive for congestion, ear pain, rhinorrhea, sinus pressure, sore throat and voice change. Negative for sinus pain.   Respiratory:  Positive for cough. Negative for shortness of breath.   Cardiovascular:  Negative for chest pain.  Gastrointestinal:  Negative for abdominal pain, nausea and vomiting.  Musculoskeletal:  Negative for arthralgias and myalgias.  Skin:  Negative for rash.  Neurological:  Negative for weakness and headaches.  Hematological:  Negative for adenopathy.     Physical  Exam Triage Vital Signs ED Triage Vitals  Enc Vitals Group     BP      Pulse      Resp      Temp      Temp src      SpO2      Weight      Height      Head Circumference      Peak Flow      Pain Score      Pain Loc      Pain Edu?      Excl. in [place]?    No data found.  Updated Vital Signs BP (!) 142/100 (BP Location: Left Arm)   Pulse (!) 113   Temp 99.4 F (37.4 C) (Oral)   Ht 5\' 4"  (1.626 m)   SpO2 97%   BMI 49.78 kg/m       Physical Exam Vitals and nursing note reviewed.  Constitutional:      General: She is not in acute distress.    Appearance: Normal appearance. She is ill-appearing. She is not toxic-appearing.  HENT:     Head: Normocephalic and atraumatic.     Right Ear: Ear canal and external ear normal. A middle ear effusion is present.     Left Ear: Ear canal and external ear normal. A middle ear effusion is present. Tympanic membrane is retracted.     Nose: Congestion present.     Mouth/Throat:     Mouth: Mucous membranes are moist.     Pharynx: Oropharynx is clear. Posterior oropharyngeal erythema present.     Tonsils: 3+ on the right. 1+ on the left.  Eyes:     General: No scleral icterus.       Right eye: No discharge.        Left eye: No discharge.     Conjunctiva/sclera: Conjunctivae normal.  Cardiovascular:     Rate and Rhythm: Regular rhythm. Tachycardia present.     Heart sounds: Normal heart sounds.  Pulmonary:     Effort: Pulmonary effort is normal. No respiratory distress.     Breath sounds: Normal breath sounds. No wheezing, rhonchi or rales.  Musculoskeletal:     Cervical back: Neck supple.  Skin:    General: Skin is dry.  Neurological:     General: No focal deficit present.     Mental Status: She is alert. Mental status is at baseline.     Motor: No weakness.     Gait: Gait normal.  Psychiatric:        Mood and Affect: Mood normal.        Behavior: Behavior normal.        Thought Content: Thought content normal.      UC Treatments / Results  Labs (all labs ordered are listed, but only abnormal results are displayed) Labs Reviewed  GROUP A STREP BY PCR    EKG   Radiology No results found.  Procedures Procedures (including critical care time)  Medications Ordered in UC Medications - No data to display  Initial Impression / Assessment and Plan / UC Course  I have reviewed the triage vital signs and the nursing notes.  Pertinent labs & imaging  results that were available during my care of the patient were reviewed by me and considered in my medical decision making (see chart for details).   32 year old female presents for sore throat, voice hoarseness, cough, congestion, sinus pressure, bilateral ear pressure and temps up to 100.2 degrees  over the past 3 days.  Denies sick contacts.  Negative COVID and flu testing yesterday.  Blood pressure 142/100.  Pulse elevated at 113 bpm.  The remainder of the vitals are normal and stable.  She is ill-appearing but nontoxic.  Mild voice hoarseness, nasal congestion, clear effusion of bilateral TMs with retraction of the left TM, erythema posterior pharynx with 3+ enlarged tonsil on the right and 1+ on the left.  Chest clear to auscultation.  PCR strep testing obtained. Negative.  Reviewed results with patient.  It is most consistent with viral URI.  Supportive care advised.  Encouraged increasing rest and fluids.  Sent Nasacort to pharmacy as well as Bromfed-DM and prednisone.  Prednisone sent to help with the swelling of the tonsil.  Reviewed return precautions.   Final Clinical Impressions(s) / UC Diagnoses   Final diagnoses:  Acute upper respiratory infection  Swelling of tonsil  Sore throat  Acute cough  Voice hoarseness     Discharge Instructions      URI/COLD SYMPTOMS: Negative strep testing. Your exam today is consistent with a viral illness. Antibiotics are not indicated at this time. Use medications as directed, including cough syrup, nasal saline, and decongestants. Your symptoms should improve over the next few days and resolve within 7-10 days. Increase rest and fluids. F/u if symptoms worsen or predominate such as sore throat, ear pain, productive cough, shortness of breath, or if you develop high fevers or worsening fatigue over the next several days.       ED Prescriptions     Medication Sig Dispense Auth. Provider   predniSONE (DELTASONE) 20 MG tablet Take 2 tablets  (40 mg total) by mouth daily for 5 days. 10 tablet Eusebio Friendly B, PA-C   brompheniramine-pseudoephedrine-DM 30-2-10 MG/5ML syrup Take 10 mLs by mouth 4 (four) times daily as needed for up to 7 days. 150 mL Eusebio Friendly B, PA-C   triamcinolone (NASACORT) 55 MCG/ACT AERO nasal inhaler Place 2 sprays into the nose daily. 1 each Gareth Morgan      PDMP not reviewed this encounter.   Shirlee Latch, PA-C 09/25/22 1044

## 2022-09-25 NOTE — Discharge Instructions (Addendum)
URI/COLD SYMPTOMS: Negative strep testing. Your exam today is consistent with a viral illness. Antibiotics are not indicated at this time. Use medications as directed, including cough syrup, nasal saline, and decongestants. Your symptoms should improve over the next few days and resolve within 7-10 days. Increase rest and fluids. F/u if symptoms worsen or predominate such as sore throat, ear pain, productive cough, shortness of breath, or if you develop high fevers or worsening fatigue over the next several days.   

## 2022-09-25 NOTE — ED Triage Notes (Signed)
Pt c/o possible sinus infection  Pt is having ear pressure, facial pressure, loss of voice, cough, headache, temperature of 100.2 x3days.  Pt states that she took a flu and covid test at work and they were negative.

## 2023-02-22 ENCOUNTER — Other Ambulatory Visit: Payer: Self-pay | Admitting: Internal Medicine

## 2023-02-23 ENCOUNTER — Ambulatory Visit: Payer: 59 | Admitting: Internal Medicine

## 2023-06-13 ENCOUNTER — Ambulatory Visit: Payer: 59 | Admitting: Internal Medicine

## 2023-06-13 ENCOUNTER — Other Ambulatory Visit: Payer: Self-pay | Admitting: Internal Medicine

## 2023-06-13 VITALS — BP 155/102 | HR 97 | Ht 64.0 in | Wt 332.0 lb

## 2023-06-13 DIAGNOSIS — I10 Essential (primary) hypertension: Secondary | ICD-10-CM | POA: Diagnosis not present

## 2023-06-13 DIAGNOSIS — G43009 Migraine without aura, not intractable, without status migrainosus: Secondary | ICD-10-CM | POA: Diagnosis not present

## 2023-06-13 DIAGNOSIS — F324 Major depressive disorder, single episode, in partial remission: Secondary | ICD-10-CM | POA: Insufficient documentation

## 2023-06-13 DIAGNOSIS — Z6841 Body Mass Index (BMI) 40.0 and over, adult: Secondary | ICD-10-CM | POA: Insufficient documentation

## 2023-06-13 MED ORDER — CHLORTHALIDONE 25 MG PO TABS: 25.0000 mg | ORAL_TABLET | Freq: Every day | ORAL | 1 refills | Status: AC

## 2023-06-13 MED ORDER — AMLODIPINE BESYLATE 10 MG PO TABS: 10.0000 mg | ORAL_TABLET | Freq: Every day | ORAL | 1 refills | Status: AC

## 2023-06-13 MED ORDER — LOSARTAN POTASSIUM 100 MG PO TABS: 100.0000 mg | ORAL_TABLET | Freq: Every day | ORAL | 1 refills | Status: AC

## 2023-06-13 MED ORDER — SUMATRIPTAN SUCCINATE 100 MG PO TABS: 100.0000 mg | ORAL_TABLET | ORAL | 0 refills | Status: AC | PRN

## 2023-06-13 MED ORDER — ESCITALOPRAM OXALATE 10 MG PO TABS: 10.0000 mg | ORAL_TABLET | Freq: Every day | ORAL | 0 refills | Status: AC

## 2023-06-13 NOTE — Assessment & Plan Note (Signed)
 New onset last year.  Bupropion from GYN has been somewhat helpful. She is still struggling with feeling down with decreased interest. Will add Lexapro and recheck next visit -

## 2023-06-13 NOTE — Assessment & Plan Note (Signed)
 Can consider GLP-1 agents if covered. Pt will call insurance and we will discuss options next visit.

## 2023-06-13 NOTE — Progress Notes (Unsigned)
    Date:  06/13/2023   Name:  Ajayla Iglesias   DOB:  Feb 14, 1991   MRN:  [id number]   Chief Complaint: No chief complaint on file.  HPI  Review of Systems   Lab Results  Component Value Date   NA 136 03/18/2022   K 3.2 (L) 03/18/2022   CO2 25 03/18/2022   GLUCOSE 88 03/18/2022   BUN 11 03/18/2022   CREATININE 0.59 03/18/2022   CALCIUM 8.8 (L) 03/18/2022   GFRNONAA >60 03/18/2022   No results found for: CHOL, HDL, LDLCALC, LDLDIRECT, TRIG, CHOLHDL No results found for: TSH No results found for: HGBA1C Lab Results  Component Value Date   WBC 8.2 03/18/2022   HGB 12.7 03/18/2022   HCT 39.0 03/18/2022   MCV 89.0 03/18/2022   PLT 364 03/18/2022   Lab Results  Component Value Date   ALT 16 03/18/2022   AST 19 03/18/2022   ALKPHOS 82 03/18/2022   BILITOT 0.5 03/18/2022   No results found for: MARIEN BOLLS, VD25OH   Patient Active Problem List   Diagnosis Date Noted   Hypertension 10/14/2021   Migraine without aura or status migrainosus 04/17/2015    Allergies  Allergen Reactions   Monistat [Miconazole]    Other Other (See Comments)    Rash and burning per patient Miconazole-skin [id number]    Past Surgical History:  Procedure Laterality Date   CESAREAN SECTION  2017    Social History   Tobacco Use   Smoking status: Never   Smokeless tobacco: Never  Vaping Use   Vaping status: Never Used  Substance Use Topics   Alcohol use: Yes    Comment: Occ.   Drug use: Never     Medication list has been reviewed and updated.  No outpatient medications have been marked as taking for the 06/13/23 encounter (Orders Only) with Justus Leita DEL, MD.        No data to display              No data to display          BP Readings from Last 3 Encounters:  09/25/22 (!) 142/100  03/18/22 (!) 180/111  10/21/21 (!) 198/106    Physical Exam  Wt Readings from Last 3 Encounters:  03/18/22 290 lb (131.5 kg)  10/21/21 (!)  302 lb (137 kg)  10/14/21 (!) 302 lb (137 kg)    There were no vitals taken for this visit.  Assessment and Plan:  Problem List Items Addressed This Visit   None   No follow-ups on file.    Leita HILARIO Justus, MD [facility] [place]

## 2023-06-13 NOTE — Assessment & Plan Note (Signed)
 Intermittent migraines unchanged Uses Advil as needed; has used Imitrex as well Will refill Imitrex for PRN use

## 2023-06-13 NOTE — Assessment & Plan Note (Signed)
 BP not controlled today due to being out of medications. Will resume previous medications and re-evaluate at next visit. Continue to avoid dietary sodium.

## 2023-06-13 NOTE — Progress Notes (Signed)
 Date:  06/13/2023   Name:  Brenda Salas   DOB:  12-05-1990   MRN:  [id number]   Chief Complaint: New Patient (Initial Visit), Hypertension, and Obesity  Hypertension This is a chronic problem. Episode onset: as a teenager. The problem is controlled. Associated symptoms include headaches. Pertinent negatives include no chest pain, palpitations or shortness of breath. Risk factors for coronary artery disease include obesity and sedentary lifestyle. Past treatments include angiotensin blockers, calcium channel blockers and diuretics. Compliance problems: takes meds regularly but ran out prior to this appointment.  There is no history of kidney disease, CAD/MI or CVA.  Depression        This is a new problem.  The current episode started more than 1 month ago.   The onset quality is gradual.   The problem occurs daily.The problem is unchanged.  Associated symptoms include fatigue and headaches.  Associated symptoms include no suicidal ideas.  Treatments tried: on bupropion.  Compliance with treatment is good.  Previous treatment provided moderate relief. Migraine  This is a chronic problem. The problem occurs seasonly. The pain does not radiate. The pain quality is similar to prior headaches. Associated symptoms include nausea and photophobia. Pertinent negatives include no fever. She has tried triptans for the symptoms. The treatment provided significant relief. Her past medical history is significant for hypertension.    Review of Systems  Constitutional:  Positive for fatigue. Negative for chills and fever.  Eyes:  Positive for photophobia.  Respiratory:  Negative for chest tightness and shortness of breath.   Cardiovascular:  Negative for chest pain and palpitations.  Gastrointestinal:  Positive for nausea.  Neurological:  Positive for headaches.  Psychiatric/Behavioral:  Positive for depression, dysphoric mood and sleep disturbance. Negative for suicidal ideas. The patient is not  nervous/anxious.      Lab Results  Component Value Date   NA 136 03/18/2022   K 3.2 (L) 03/18/2022   CO2 25 03/18/2022   GLUCOSE 88 03/18/2022   BUN 11 03/18/2022   CREATININE 0.59 03/18/2022   CALCIUM 8.8 (L) 03/18/2022   GFRNONAA >60 03/18/2022   No results found for: CHOL, HDL, LDLCALC, LDLDIRECT, TRIG, CHOLHDL No results found for: TSH No results found for: HGBA1C Lab Results  Component Value Date   WBC 8.2 03/18/2022   HGB 12.7 03/18/2022   HCT 39.0 03/18/2022   MCV 89.0 03/18/2022   PLT 364 03/18/2022   Lab Results  Component Value Date   ALT 16 03/18/2022   AST 19 03/18/2022   ALKPHOS 82 03/18/2022   BILITOT 0.5 03/18/2022   No results found for: MARIEN BOLLS, VD25OH   Patient Active Problem List   Diagnosis Date Noted   Major depression single episode, in partial remission (HCC) 06/13/2023   BMI 50.0-59.9, adult (HCC) 06/13/2023   Essential hypertension 10/14/2021   Migraine without aura or status migrainosus 04/17/2015    Allergies  Allergen Reactions   Monistat [Miconazole]    Other Other (See Comments)    Rash and burning per patient Miconazole-skin [id number]    Past Surgical History:  Procedure Laterality Date   CESAREAN SECTION  2017    Social History   Tobacco Use   Smoking status: Never   Smokeless tobacco: Never  Vaping Use   Vaping status: Never Used  Substance Use Topics   Alcohol use: Yes    Alcohol/week: 2.0 standard drinks of alcohol    Types: 2 Shots of liquor per week  Comment: Occ.   Drug use: Never     Medication list has been reviewed and updated.  Current Meds  Medication Sig   buPROPion (WELLBUTRIN XL) 150 MG 24 hr tablet Take by mouth.   escitalopram  (LEXAPRO ) 10 MG tablet Take 1 tablet (10 mg total) by mouth daily.   etonogestrel-ethinyl estradiol (NUVARING) 0.12-0.015 MG/24HR vaginal ring Place vaginally.   ibuprofen (ADVIL) 800 MG tablet Take by mouth.   SUMAtriptan  (IMITREX )  100 MG tablet Take 1 tablet (100 mg total) by mouth every 2 (two) hours as needed for migraine. May repeat in 2 hours if headache persists or recurs.   [DISCONTINUED] amLODipine  (NORVASC ) 10 MG tablet Take 1 tablet (10 mg total) by mouth daily.   [DISCONTINUED] chlorthalidone  (HYGROTON ) 25 MG tablet Take 25 mg by mouth daily.   [DISCONTINUED] losartan  (COZAAR ) 100 MG tablet Take 100 mg by mouth daily.   [DISCONTINUED] losartan  (COZAAR ) 25 MG tablet Take 25 mg by mouth daily.       06/13/2023    3:12 PM  GAD 7 : Generalized Anxiety Score  Nervous, Anxious, on Edge 0  Control/stop worrying 0  Worry too much - different things 0  Trouble relaxing 0  Restless 0  Easily annoyed or irritable 2  Afraid - awful might happen 0  Total GAD 7 Score 2  Anxiety Difficulty Not difficult at all       06/13/2023    3:12 PM  Depression screen PHQ 2/9  Decreased Interest 1  Down, Depressed, Hopeless 1  PHQ - 2 Score 2  Altered sleeping 0  Tired, decreased energy 2  Change in appetite 2  Feeling bad or failure about yourself  0  Trouble concentrating 0  Moving slowly or fidgety/restless 0  Suicidal thoughts 0  PHQ-9 Score 6  Difficult doing work/chores Not difficult at all    BP Readings from Last 3 Encounters:  06/13/23 (!) 155/102  09/25/22 (!) 142/100  03/18/22 (!) 180/111    Physical Exam Vitals and nursing note reviewed.  Constitutional:      General: She is not in acute distress.    Appearance: Normal appearance. She is well-developed.  HENT:     Head: Normocephalic and atraumatic.  Neck:     Vascular: No carotid bruit.  Cardiovascular:     Rate and Rhythm: Normal rate and regular rhythm.     Heart sounds: No murmur heard. Pulmonary:     Effort: Pulmonary effort is normal. No respiratory distress.     Breath sounds: No wheezing or rhonchi.  Musculoskeletal:     Cervical back: Normal range of motion.     Right lower leg: No edema.     Left lower leg: No edema.   Lymphadenopathy:     Cervical: No cervical adenopathy.  Skin:    General: Skin is warm and dry.     Findings: No rash.  Neurological:     Mental Status: She is alert and oriented to person, place, and time.  Psychiatric:        Mood and Affect: Mood normal.        Behavior: Behavior normal.     Wt Readings from Last 3 Encounters:  06/13/23 (!) 332 lb (150.6 kg)  03/18/22 290 lb (131.5 kg)  10/21/21 (!) 302 lb (137 kg)    BP (!) 155/102   Pulse 97   Ht 5' 4 (1.626 m)   Wt (!) 332 lb (150.6 kg)   SpO2 97%  BMI 56.99 kg/m   Assessment and Plan:  Problem List Items Addressed This Visit       Unprioritized   Migraine without aura or status migrainosus - Primary   Intermittent migraines unchanged Uses Advil as needed; has used Imitrex  as well Will refill Imitrex  for PRN use      Relevant Medications   amLODipine  (NORVASC ) 10 MG tablet   losartan  (COZAAR ) 100 MG tablet   chlorthalidone  (HYGROTON ) 25 MG tablet   SUMAtriptan  (IMITREX ) 100 MG tablet   escitalopram  (LEXAPRO ) 10 MG tablet   Essential hypertension   BP not controlled today due to being out of medications. Will resume previous medications and re-evaluate at next visit. Continue to avoid dietary sodium.      Relevant Medications   amLODipine  (NORVASC ) 10 MG tablet   losartan  (COZAAR ) 100 MG tablet   chlorthalidone  (HYGROTON ) 25 MG tablet   Other Relevant Orders   CBC with Differential/Platelet   Comprehensive metabolic panel   TSH   Major depression single episode, in partial remission (HCC)   New onset last year.  Bupropion from GYN has been somewhat helpful. She is still struggling with feeling down with decreased interest. Will add Lexapro  and recheck next visit -      Relevant Medications   escitalopram  (LEXAPRO ) 10 MG tablet   BMI 50.0-59.9, adult (HCC)   Can consider GLP-1 agents if covered. Pt will call insurance and we will discuss options next visit.       Return in about 6  weeks (around 07/25/2023).    Leita HILARIO Adie, MD [facility] [place]

## 2023-06-14 LAB — COMPREHENSIVE METABOLIC PANEL
ALT: 12 [IU]/L (ref 0–32)
AST: 16 [IU]/L (ref 0–40)
Albumin: 3.9 g/dL (ref 3.9–4.9)
Alkaline Phosphatase: 123 [IU]/L — ABNORMAL HIGH (ref 44–121)
BUN/Creatinine Ratio: 11 (ref 9–23)
BUN: 7 mg/dL (ref 6–20)
Bilirubin Total: 0.5 mg/dL (ref 0.0–1.2)
CO2: 23 mmol/L (ref 20–29)
Calcium: 9.8 mg/dL (ref 8.7–10.2)
Chloride: 100 mmol/L (ref 96–106)
Creatinine, Ser: 0.64 mg/dL (ref 0.57–1.00)
Globulin, Total: 3.2 g/dL (ref 1.5–4.5)
Glucose: 76 mg/dL (ref 70–99)
Potassium: 4.1 mmol/L (ref 3.5–5.2)
Sodium: 137 mmol/L (ref 134–144)
Total Protein: 7.1 g/dL (ref 6.0–8.5)
eGFR: 120 mL/min/{1.73_m2} (ref >59)

## 2023-06-14 LAB — CBC WITH DIFFERENTIAL/PLATELET
Basophils Absolute: 0 10*3/uL (ref 0.0–0.2)
Basos: 0 %
EOS (ABSOLUTE): 0.1 10*3/uL (ref 0.0–0.4)
Eos: 1 %
Hematocrit: 42.3 % (ref 34.0–46.6)
Hemoglobin: 13.9 g/dL (ref 11.1–15.9)
Immature Grans (Abs): 0.1 10*3/uL (ref 0.0–0.1)
Immature Granulocytes: 1 %
Lymphocytes Absolute: 2.1 10*3/uL (ref 0.7–3.1)
Lymphs: 22 %
MCH: 29.2 pg (ref 26.6–33.0)
MCHC: 32.9 g/dL (ref 31.5–35.7)
MCV: 89 fL (ref 79–97)
Monocytes Absolute: 0.7 10*3/uL (ref 0.1–0.9)
Monocytes: 7 %
Neutrophils Absolute: 6.4 10*3/uL (ref 1.4–7.0)
Neutrophils: 69 %
Platelets: 447 10*3/uL (ref 150–450)
RBC: 4.76 x10E6/uL (ref 3.77–5.28)
RDW: 12 % (ref 11.7–15.4)
WBC: 9.5 10*3/uL (ref 3.4–10.8)

## 2023-06-14 LAB — TSH: TSH: 0.78 u[IU]/mL (ref 0.450–4.500)

## 2023-07-26 ENCOUNTER — Ambulatory Visit: Payer: Self-pay | Admitting: Internal Medicine

## 2023-08-05 ENCOUNTER — Ambulatory Visit: Payer: 59 | Admitting: Internal Medicine

## 2023-10-26 ENCOUNTER — Encounter: Payer: Self-pay | Admitting: Internal Medicine

## 2023-10-26 NOTE — Telephone Encounter (Signed)
 Please review and advise.   JM

## 2023-10-26 NOTE — Telephone Encounter (Signed)
 PT response.  JM

## 2023-10-27 NOTE — Telephone Encounter (Signed)
 Pt responded .
